# Patient Record
Sex: Female | Born: 1955 | Race: Black or African American | Hispanic: No | Marital: Single | Smoking: Never smoker
Health system: Southern US, Community
[De-identification: ages and names within clinical notes are randomized; demographics above are authoritative.]

---

## 1999-12-20 ENCOUNTER — Encounter: Payer: Self-pay | Admitting: Pediatrics

## 1999-12-20 ENCOUNTER — Encounter: Admission: RE | Admit: 1999-12-20 | Discharge: 1999-12-20 | Payer: Self-pay | Admitting: *Deleted

## 2000-12-20 ENCOUNTER — Encounter: Admission: RE | Admit: 2000-12-20 | Discharge: 2000-12-20 | Payer: Self-pay | Admitting: *Deleted

## 2000-12-20 ENCOUNTER — Encounter: Payer: Self-pay | Admitting: Pediatrics

## 2001-12-23 ENCOUNTER — Encounter: Admission: RE | Admit: 2001-12-23 | Discharge: 2001-12-23 | Payer: Self-pay | Admitting: *Deleted

## 2001-12-23 ENCOUNTER — Encounter: Payer: Self-pay | Admitting: Pediatrics

## 2003-01-15 ENCOUNTER — Encounter: Admission: RE | Admit: 2003-01-15 | Discharge: 2003-01-15 | Payer: Self-pay

## 2004-01-21 ENCOUNTER — Ambulatory Visit (HOSPITAL_COMMUNITY): Admission: RE | Admit: 2004-01-21 | Discharge: 2004-01-21 | Payer: Self-pay | Admitting: Unknown Physician Specialty

## 2004-12-22 ENCOUNTER — Ambulatory Visit (HOSPITAL_COMMUNITY): Admission: RE | Admit: 2004-12-22 | Discharge: 2004-12-22 | Payer: Self-pay | Admitting: *Deleted

## 2005-12-28 ENCOUNTER — Ambulatory Visit (HOSPITAL_COMMUNITY): Admission: RE | Admit: 2005-12-28 | Discharge: 2005-12-28 | Payer: Self-pay | Admitting: Unknown Physician Specialty

## 2006-12-04 ENCOUNTER — Emergency Department (HOSPITAL_COMMUNITY): Admission: EM | Admit: 2006-12-04 | Discharge: 2006-12-04 | Payer: Self-pay | Admitting: Family Medicine

## 2007-01-15 ENCOUNTER — Ambulatory Visit (HOSPITAL_COMMUNITY): Admission: RE | Admit: 2007-01-15 | Discharge: 2007-01-15 | Payer: Self-pay | Admitting: Unknown Physician Specialty

## 2007-07-26 ENCOUNTER — Emergency Department (HOSPITAL_COMMUNITY): Admission: EM | Admit: 2007-07-26 | Discharge: 2007-07-26 | Payer: Self-pay | Admitting: Family Medicine

## 2007-12-11 ENCOUNTER — Emergency Department (HOSPITAL_COMMUNITY): Admission: EM | Admit: 2007-12-11 | Discharge: 2007-12-11 | Payer: Self-pay | Admitting: Emergency Medicine

## 2008-01-14 ENCOUNTER — Ambulatory Visit (HOSPITAL_COMMUNITY): Admission: RE | Admit: 2008-01-14 | Discharge: 2008-01-14 | Payer: Self-pay | Admitting: Unknown Physician Specialty

## 2008-02-03 ENCOUNTER — Emergency Department (HOSPITAL_COMMUNITY): Admission: EM | Admit: 2008-02-03 | Discharge: 2008-02-03 | Payer: Self-pay | Admitting: Emergency Medicine

## 2008-04-27 ENCOUNTER — Encounter: Admission: RE | Admit: 2008-04-27 | Discharge: 2008-04-27 | Payer: Self-pay | Admitting: Infectious Diseases

## 2009-01-25 ENCOUNTER — Ambulatory Visit (HOSPITAL_COMMUNITY): Admission: RE | Admit: 2009-01-25 | Discharge: 2009-01-25 | Payer: Self-pay | Admitting: Unknown Physician Specialty

## 2009-03-27 ENCOUNTER — Emergency Department (HOSPITAL_COMMUNITY): Admission: EM | Admit: 2009-03-27 | Discharge: 2009-03-27 | Payer: Self-pay | Admitting: Family Medicine

## 2010-01-01 IMAGING — MG MS DIGITAL SCREENING BILAT
4 series · 4 of 4 positions shown · non-contrast
Comparison: none

DG SCHOLARSHIP MAMMOGRAM
Bilateral CC and MLO view(s) were taken.
Technologist: Gashi Walland, RT RM

DIGITAL SCREENING MAMMOGRAM WITH CAD:
The breast tissue is heterogeneously dense.  No masses or malignant type calcifications are 
identified.  Compared with prior studies.

[R CC]
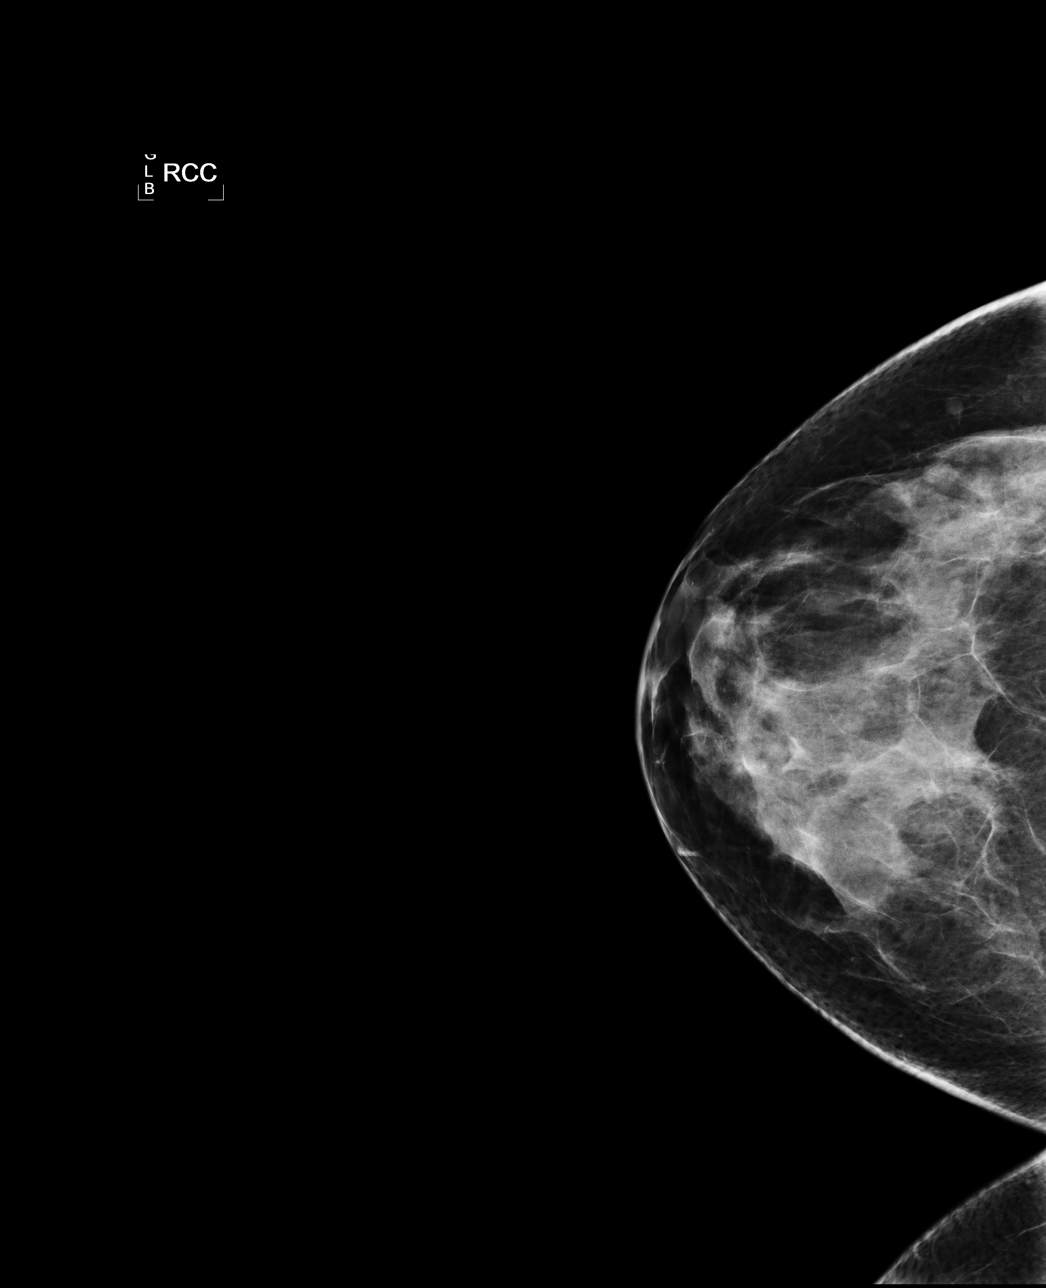

[R MLO]
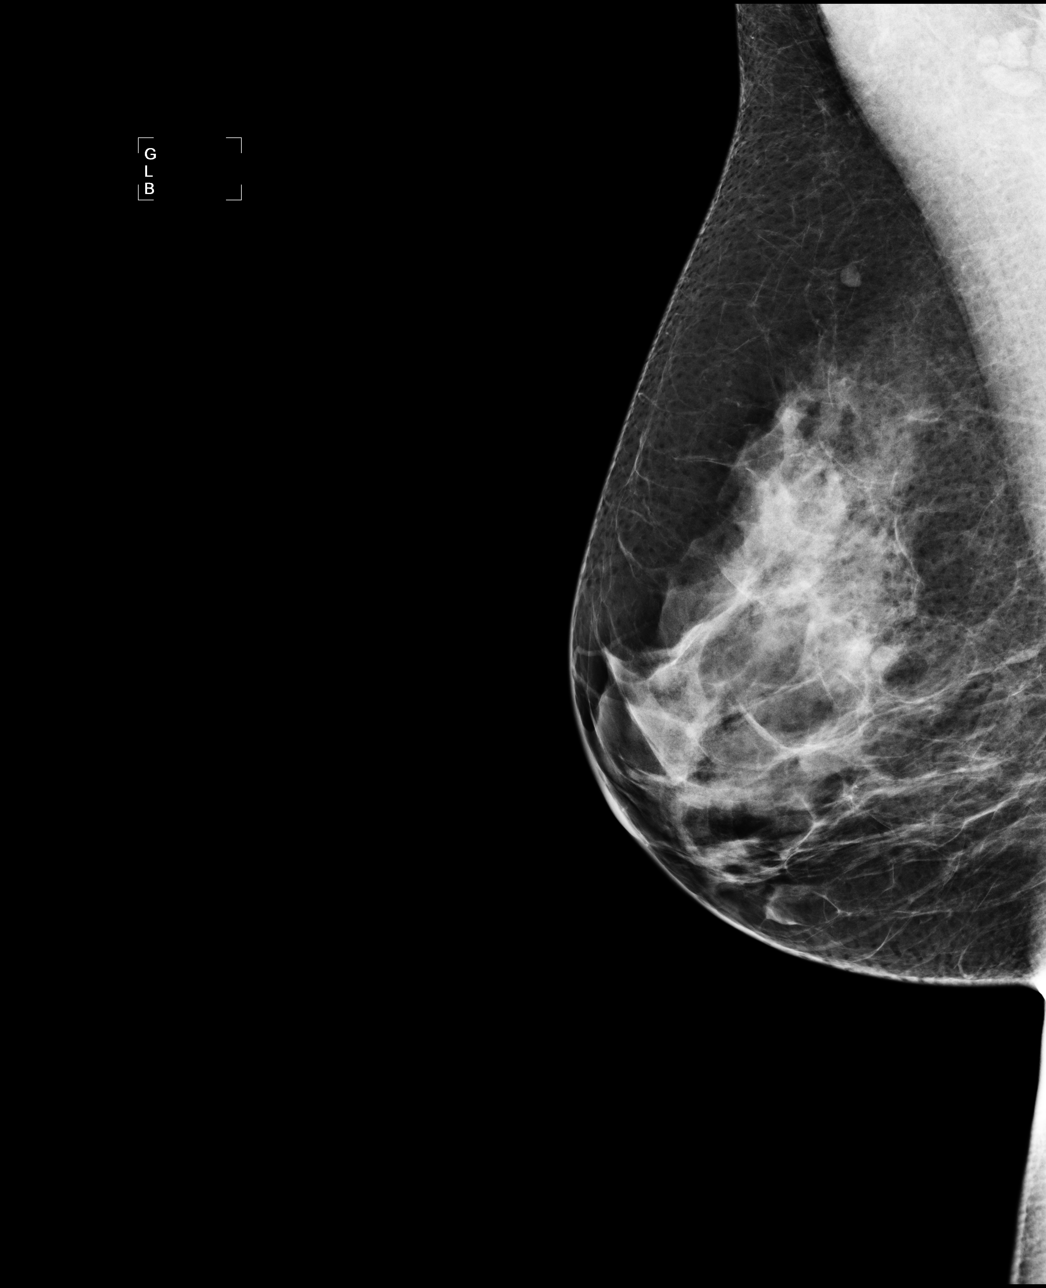

[L CC]
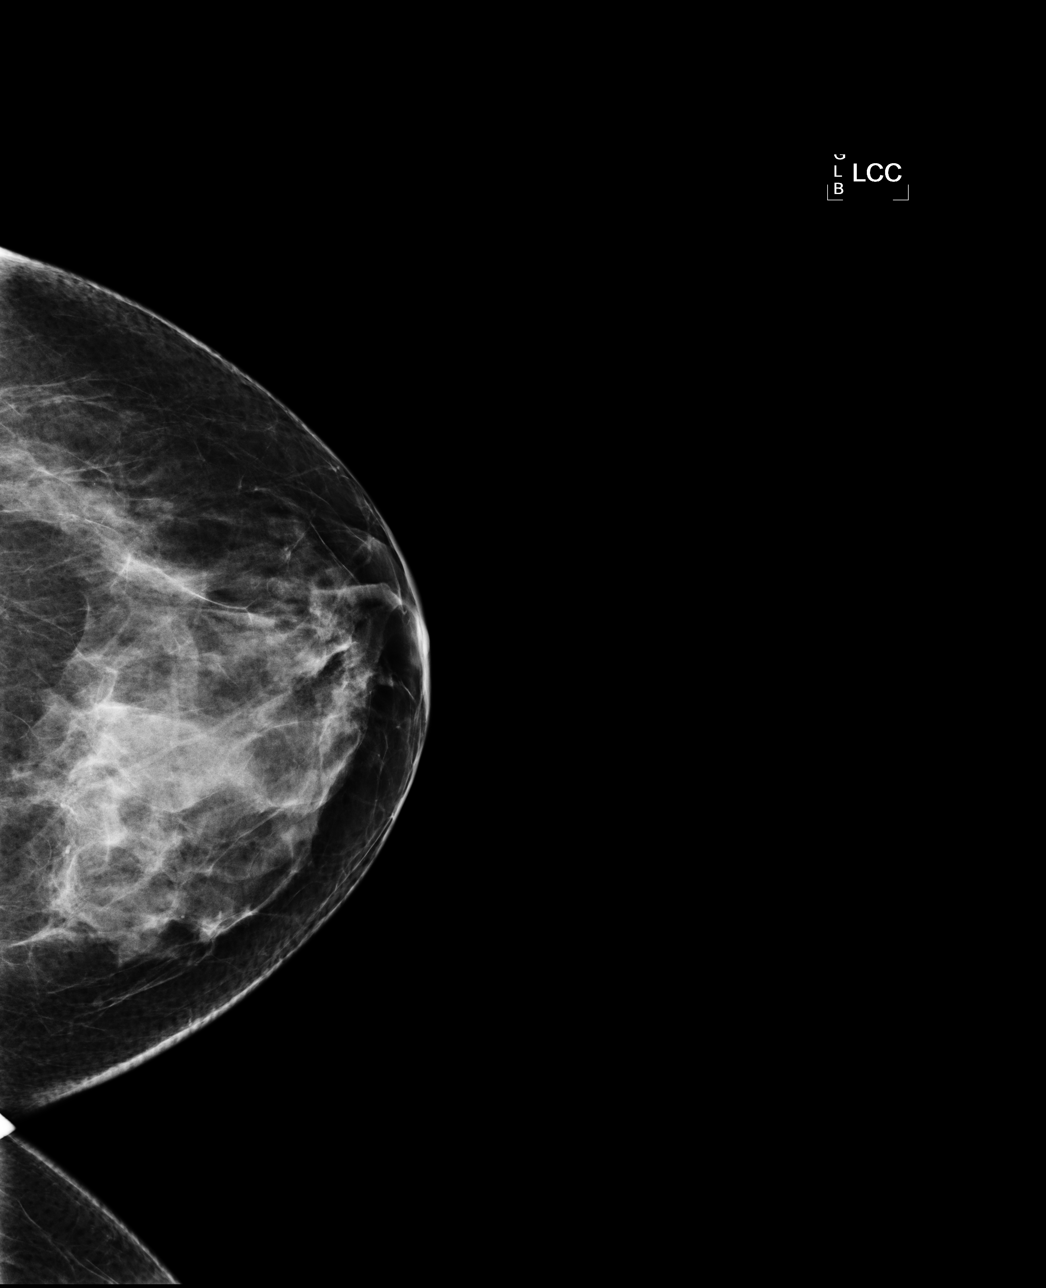

[L MLO]
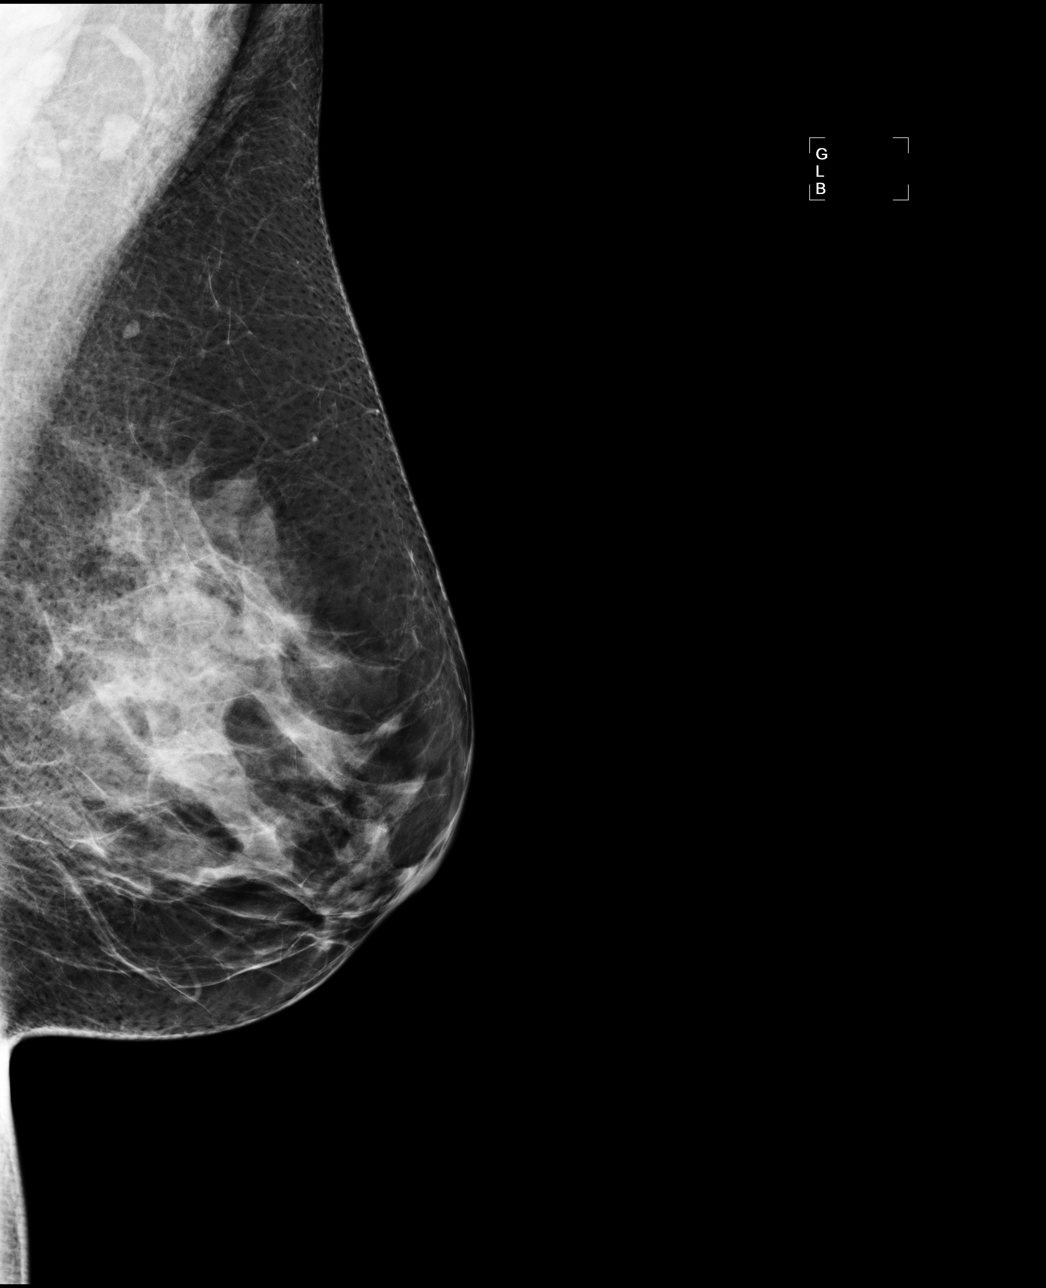

[4 of 4 positions shown; findings below may reference images not displayed]

IMPRESSION: No specific mammographic evidence of malignancy.  Next screening mammogram is recommended in one 
year.

A result letter of this screening mammogram will be mailed directly to the patient.

ASSESSMENT: Negative - BI-RADS 1

Screening mammogram in 1 year.
ANALYZED BY COMPUTER AIDED DETECTION. , THIS PROCEDURE WAS A DIGITAL MAMMOGRAM.

## 2010-01-26 ENCOUNTER — Ambulatory Visit (HOSPITAL_COMMUNITY): Admission: RE | Admit: 2010-01-26 | Discharge: 2010-01-26 | Payer: Self-pay | Admitting: Unknown Physician Specialty

## 2010-04-05 ENCOUNTER — Emergency Department (HOSPITAL_COMMUNITY): Admission: EM | Admit: 2010-04-05 | Discharge: 2010-04-05 | Payer: Self-pay | Admitting: Emergency Medicine

## 2010-10-23 ENCOUNTER — Encounter: Payer: Self-pay | Admitting: Unknown Physician Specialty

## 2010-10-24 ENCOUNTER — Encounter: Payer: Self-pay | Admitting: Infectious Diseases

## 2010-12-18 LAB — POCT RAPID STREP A (OFFICE): Streptococcus, Group A Screen (Direct): NEGATIVE

## 2011-01-09 ENCOUNTER — Other Ambulatory Visit (HOSPITAL_COMMUNITY): Payer: Self-pay | Admitting: *Deleted

## 2011-02-08 ENCOUNTER — Other Ambulatory Visit (HOSPITAL_COMMUNITY): Payer: Self-pay | Admitting: *Deleted

## 2011-02-08 DIAGNOSIS — Z1231 Encounter for screening mammogram for malignant neoplasm of breast: Secondary | ICD-10-CM

## 2011-02-20 ENCOUNTER — Ambulatory Visit (HOSPITAL_COMMUNITY)
Admission: RE | Admit: 2011-02-20 | Discharge: 2011-02-20 | Disposition: A | Discharge status: Home / Self Care | Payer: Self-pay | Source: Ambulatory Visit | Attending: *Deleted | Admitting: *Deleted

## 2011-02-20 DIAGNOSIS — Z1231 Encounter for screening mammogram for malignant neoplasm of breast: Secondary | ICD-10-CM

## 2014-02-13 ENCOUNTER — Other Ambulatory Visit (HOSPITAL_COMMUNITY): Payer: Self-pay | Admitting: Physician Assistant

## 2014-02-13 DIAGNOSIS — Z1231 Encounter for screening mammogram for malignant neoplasm of breast: Secondary | ICD-10-CM

## 2014-02-20 ENCOUNTER — Ambulatory Visit (HOSPITAL_COMMUNITY): Payer: Self-pay

## 2020-07-28 ENCOUNTER — Other Ambulatory Visit: Payer: Self-pay

## 2020-07-28 DIAGNOSIS — Z20822 Contact with and (suspected) exposure to covid-19: Secondary | ICD-10-CM

## 2020-07-29 LAB — NOVEL CORONAVIRUS, NAA: SARS-CoV-2, NAA: NOT DETECTED

## 2020-07-29 LAB — SARS-COV-2, NAA 2 DAY TAT

## 2020-07-31 ENCOUNTER — Ambulatory Visit: Payer: Self-pay | Attending: Internal Medicine

## 2020-07-31 DIAGNOSIS — Z23 Encounter for immunization: Secondary | ICD-10-CM

## 2020-07-31 NOTE — Progress Notes (Signed)
   Covid-19 Vaccination [facility]  Name:  Emily Cabrera    MRN: [id number] DOB: 1955-11-07  07/31/2020  [name] was observed post Covid-19 immunization for 15 minutes without incident. She was provided with Vaccine Information Sheet and instruction to access the V-Safe system.   [name] was instructed to call 911 with any severe reactions post vaccine: Marland Kitchen Difficulty breathing  . Swelling of face and throat  . A fast heartbeat  . A bad rash all over body  . Dizziness and weakness

## 2021-01-16 ENCOUNTER — Ambulatory Visit (HOSPITAL_COMMUNITY)
Admission: EM | Admit: 2021-01-16 | Discharge: 2021-01-16 | Disposition: A | Discharge status: Home / Self Care | Payer: BLUE CROSS/BLUE SHIELD | Attending: Emergency Medicine | Admitting: Emergency Medicine

## 2021-01-16 ENCOUNTER — Encounter (HOSPITAL_COMMUNITY): Payer: Self-pay

## 2021-01-16 ENCOUNTER — Other Ambulatory Visit: Payer: Self-pay

## 2021-01-16 DIAGNOSIS — J069 Acute upper respiratory infection, unspecified: Secondary | ICD-10-CM

## 2021-01-16 DIAGNOSIS — J209 Acute bronchitis, unspecified: Secondary | ICD-10-CM | POA: Diagnosis not present

## 2021-01-16 MED ORDER — METHYLPREDNISOLONE SODIUM SUCC 125 MG IJ SOLR
60.0000 mg | Freq: Once | INTRAMUSCULAR | Status: AC
  Administered 2021-01-16: 60 mg via INTRAMUSCULAR

## 2021-01-16 MED ORDER — METHYLPREDNISOLONE SODIUM SUCC 125 MG IJ SOLR
INTRAMUSCULAR | Status: AC
  Filled 2021-01-16: qty 2

## 2021-01-16 MED ORDER — ALBUTEROL SULFATE HFA 108 (90 BASE) MCG/ACT IN AERS: 2.0000 | INHALATION_SPRAY | RESPIRATORY_TRACT | 0 refills | Status: AC | PRN

## 2021-01-16 MED ORDER — PROMETHAZINE-DM 6.25-15 MG/5ML PO SYRP: 5.0000 mL | ORAL_SOLUTION | Freq: Four times a day (QID) | ORAL | 0 refills | Status: AC | PRN

## 2021-01-16 MED ORDER — BENZONATATE 100 MG PO CAPS: 200.0000 mg | ORAL_CAPSULE | Freq: Three times a day (TID) | ORAL | 0 refills | Status: AC | PRN

## 2021-01-16 NOTE — Discharge Instructions (Addendum)
Keep taking your allergy medications and decongestants.  You can take [employer] as needed for cough.  You can take Promethazine DM as needed for cough at night.  Honey 1/2 to 1 teaspoon can be used for cough relief as well.  Use the inhaler 2 puffs every 4 hours as needed for shortness of breath, chest tightness, and wheezing.  You can take Tylenol and/or Ibuprofen as needed for fever reduction and pain relief.   For sore throat: try warm salt water gargles, cepacol lozenges, throat spray, warm tea or water with lemon/honey, popsicles or ice, or OTC cold relief medicine for throat discomfort.    For congestion: take a daily anti-histamine like Zyrtec, Claritin, and a oral decongestant to help with post nasal drip that may be irritating your throat. You can also use Flonase 2 sprays in each nostril daily.    It is important to stay hydrated: drink plenty of fluids (water, gatorade/powerade/pedialyte, juices, or teas) to keep your throat moisturized and help further relieve irritation/discomfort.   Return or go to the Emergency Department if symptoms worsen or do not improve in the next few days.

## 2021-01-16 NOTE — ED Triage Notes (Signed)
Pt presents with productive cough X 3 days with no relief with OTC medication.

## 2021-01-16 NOTE — ED Provider Notes (Signed)
[facility]    CSN: [id number] Arrival date & time: 01/16/21  1435      History   Chief Complaint Chief Complaint  Patient presents with  . Cough    HPI Emily Cabrera is a 65 y.o. female.   Patient here for evaluation of cough and chest congestion that have been ongoing for the past 3 days.  Reports taking OTC allergy medications and decongestants with some relief of nasal congestion but reports cough has been worsening.  Reports working outside and history of seasonal allergies.  Reports cough has become productive and coughing up thick white mucus.  Denies any recent sick contacts. Denies any specific alleviating or aggravating factors.  Denies any fevers, chest pain, shortness of breath, N/V/D, numbness, tingling, weakness, abdominal pain, or headaches.   ROS: As per HPI, all other pertinent ROS negative  The history is provided by the patient.    History reviewed. No pertinent past medical history.  There are no problems to display for this patient.   History reviewed. No pertinent surgical history.  OB History   No obstetric history on file.      Home Medications    Prior to Admission medications   Medication Sig Start Date End Date Taking? Authorizing Provider  albuterol (VENTOLIN HFA) 108 (90 Base) MCG/ACT inhaler Inhale 2 puffs into the lungs every 4 (four) hours as needed for wheezing or shortness of breath. 01/16/21  Yes Ivette Loyal, NP  benzonatate (TESSALON PERLES) 100 MG capsule Take 2 capsules (200 mg total) by mouth 3 (three) times daily as needed for cough. 01/16/21  Yes Ivette Loyal, NP  promethazine-dextromethorphan (PROMETHAZINE-DM) 6.25-15 MG/5ML syrup Take 5 mLs by mouth 4 (four) times daily as needed for cough. 01/16/21  Yes Ivette Loyal, NP    Family History Family History  Family history unknown: Yes    Social History Social History   Tobacco Use  . Smoking status: Never Smoker     Allergies   Patient has no  known allergies.   Review of Systems Review of Systems  Constitutional: Negative for fever.  HENT: Positive for congestion and rhinorrhea.   Respiratory: Positive for cough. Negative for shortness of breath.   Cardiovascular: Negative for chest pain.  All other systems reviewed and are negative.    Physical Exam Triage Vital Signs ED Triage Vitals  Enc Vitals Group     BP 01/16/21 1543 134/82     Pulse Rate 01/16/21 1543 70     Resp 01/16/21 1543 17     Temp 01/16/21 1543 (!) 97.5 F (36.4 C)     Temp Source 01/16/21 1543 Oral     SpO2 01/16/21 1543 99 %     Weight --      Height --      Head Circumference --      Peak Flow --      Pain Score 01/16/21 1542 0     Pain Loc --      Pain Edu? --      Excl. in GC? --    No data found.  Updated Vital Signs BP 134/82 (BP Location: Right Arm)   Pulse 70   Temp (!) 97.5 F (36.4 C) (Oral)   Resp 17   SpO2 99%   Visual Acuity Right Eye Distance:   Left Eye Distance:   Bilateral Distance:    Right Eye Near:   Left Eye Near:    Bilateral Near:  Physical Exam Vitals and nursing note reviewed.  Constitutional:      General: She is not in acute distress.    Appearance: Normal appearance. She is not ill-appearing, toxic-appearing or diaphoretic.  HENT:     Head: Normocephalic and atraumatic.     Nose: Nose normal.     Mouth/Throat:     Mouth: Mucous membranes are moist.  Eyes:     Conjunctiva/sclera: Conjunctivae normal.  Cardiovascular:     Rate and Rhythm: Normal rate and regular rhythm.     Pulses: Normal pulses.     Heart sounds: Normal heart sounds.  Pulmonary:     Effort: Pulmonary effort is normal.     Breath sounds: Normal breath sounds.  Abdominal:     General: Abdomen is flat.  Musculoskeletal:        General: Normal range of motion.     Cervical back: Normal range of motion.  Skin:    General: Skin is warm and dry.  Neurological:     General: No focal deficit present.     Mental Status:  She is alert and oriented to person, place, and time.  Psychiatric:        Mood and Affect: Mood normal.      UC Treatments / Results  Labs (all labs ordered are listed, but only abnormal results are displayed) Labs Reviewed - No data to display  EKG   Radiology No results found.  Procedures Procedures (including critical care time)  Medications Ordered in UC Medications  methylPREDNISolone sodium succinate (SOLU-MEDROL) 125 mg/2 mL injection 60 mg (60 mg Intramuscular Given 01/16/21 1615)    Initial Impression / Assessment and Plan / UC Course  I have reviewed the triage vital signs and the nursing notes.  Pertinent labs & imaging results that were available during my care of the patient were reviewed by me and considered in my medical decision making (see chart for details).     This point negative for red flags or concerns.  Likely acute bronchitis with viral URI.  Patient requesting antibiotics but discussed that this is likely viral therefore antibiotics would not be beneficial and offered Solu-Medrol IM instead.  Patient will be given Solu-Medrol IM in office.  Tessalon Perles and Promethazine DM prescribed as needed for cough.  Albuterol inhaler 2 puffs every 4 hours as needed for shortness of breath, chest tightness and wheezing.  Discussed conservative symptom management with OTC medications as well as using honey for cough suppression.  Continue home medications as prescribed.  Encourage fluids and rest.  Follow-up with primary care if symptoms do not improve or worsen over the next few days.  Final Clinical Impressions(s) / UC Diagnoses   Final diagnoses:  Acute bronchitis, unspecified organism  Viral URI with cough     Discharge Instructions     Keep taking your allergy medications and decongestants.  You can take [employer] as needed for cough.  You can take Promethazine DM as needed for cough at night.  Honey 1/2 to 1 teaspoon can be used for cough  relief as well.  Use the inhaler 2 puffs every 4 hours as needed for shortness of breath, chest tightness, and wheezing.  You can take Tylenol and/or Ibuprofen as needed for fever reduction and pain relief.   For sore throat: try warm salt water gargles, cepacol lozenges, throat spray, warm tea or water with lemon/honey, popsicles or ice, or OTC cold relief medicine for throat discomfort.    For congestion:  take a daily anti-histamine like Zyrtec, Claritin, and a oral decongestant to help with post nasal drip that may be irritating your throat. You can also use Flonase 2 sprays in each nostril daily.    It is important to stay hydrated: drink plenty of fluids (water, gatorade/powerade/pedialyte, juices, or teas) to keep your throat moisturized and help further relieve irritation/discomfort.   Return or go to the Emergency Department if symptoms worsen or do not improve in the next few days.      ED Prescriptions    Medication Sig Dispense Auth. Provider   benzonatate (TESSALON PERLES) 100 MG capsule Take 2 capsules (200 mg total) by mouth 3 (three) times daily as needed for cough. 20 capsule Ivette Loyal, NP   albuterol (VENTOLIN HFA) 108 (90 Base) MCG/ACT inhaler Inhale 2 puffs into the lungs every 4 (four) hours as needed for wheezing or shortness of breath. 18 g Ivette Loyal, NP   promethazine-dextromethorphan (PROMETHAZINE-DM) 6.25-15 MG/5ML syrup Take 5 mLs by mouth 4 (four) times daily as needed for cough. 118 mL Ivette Loyal, NP     PDMP not reviewed this encounter.   Ivette Loyal, NP 01/16/21 [phone]

## 2024-04-18 ENCOUNTER — Encounter: Payer: Self-pay | Admitting: Advanced Practice Midwife

## 2024-06-02 DEATH — deceased
